# Patient Record
Sex: Female | Born: 1960 | Race: White | Hispanic: No | Smoking: Never smoker
Health system: Southern US, Community
[De-identification: ages and names within clinical notes are randomized; demographics above are authoritative.]

## PROBLEM LIST (undated history)

## (undated) DIAGNOSIS — K219 Gastro-esophageal reflux disease without esophagitis: Secondary | ICD-10-CM

## (undated) DIAGNOSIS — T7840XA Allergy, unspecified, initial encounter: Secondary | ICD-10-CM

## (undated) DIAGNOSIS — I10 Essential (primary) hypertension: Secondary | ICD-10-CM

## (undated) DIAGNOSIS — E7800 Pure hypercholesterolemia, unspecified: Secondary | ICD-10-CM

## (undated) DIAGNOSIS — M1990 Unspecified osteoarthritis, unspecified site: Secondary | ICD-10-CM

## (undated) DIAGNOSIS — IMO0002 Reserved for concepts with insufficient information to code with codable children: Secondary | ICD-10-CM

## (undated) DIAGNOSIS — I639 Cerebral infarction, unspecified: Secondary | ICD-10-CM

## (undated) DIAGNOSIS — E079 Disorder of thyroid, unspecified: Secondary | ICD-10-CM

## (undated) DIAGNOSIS — G4730 Sleep apnea, unspecified: Secondary | ICD-10-CM

## (undated) HISTORY — PX: BACK SURGERY: SHX140

## (undated) HISTORY — PX: KNEE ARTHROSCOPY: SUR90

## (undated) HISTORY — PX: WRIST ARTHROSCOPY: SHX838

## (undated) HISTORY — PX: CHOLECYSTECTOMY: SHX55

## (undated) HISTORY — PX: ABDOMINAL HYSTERECTOMY: SHX81

## (undated) HISTORY — DX: Unspecified osteoarthritis, unspecified site: M19.90

## (undated) HISTORY — DX: Gastro-esophageal reflux disease without esophagitis: K21.9

## (undated) HISTORY — DX: Reserved for concepts with insufficient information to code with codable children: IMO0002

## (undated) HISTORY — DX: Sleep apnea, unspecified: G47.30

## (undated) HISTORY — DX: Cerebral infarction, unspecified: I63.9

## (undated) HISTORY — DX: Allergy, unspecified, initial encounter: T78.40XA

---

## 2013-03-06 ENCOUNTER — Encounter (HOSPITAL_COMMUNITY): Payer: Self-pay | Admitting: *Deleted

## 2013-03-06 DIAGNOSIS — R1110 Vomiting, unspecified: Secondary | ICD-10-CM | POA: Insufficient documentation

## 2013-03-06 DIAGNOSIS — G43909 Migraine, unspecified, not intractable, without status migrainosus: Secondary | ICD-10-CM | POA: Insufficient documentation

## 2013-03-06 DIAGNOSIS — Z8639 Personal history of other endocrine, nutritional and metabolic disease: Secondary | ICD-10-CM | POA: Insufficient documentation

## 2013-03-06 DIAGNOSIS — I10 Essential (primary) hypertension: Secondary | ICD-10-CM | POA: Insufficient documentation

## 2013-03-06 DIAGNOSIS — E079 Disorder of thyroid, unspecified: Secondary | ICD-10-CM | POA: Insufficient documentation

## 2013-03-06 DIAGNOSIS — Z862 Personal history of diseases of the blood and blood-forming organs and certain disorders involving the immune mechanism: Secondary | ICD-10-CM | POA: Insufficient documentation

## 2013-03-06 DIAGNOSIS — Z880 Allergy status to penicillin: Secondary | ICD-10-CM | POA: Insufficient documentation

## 2013-03-06 DIAGNOSIS — Z79899 Other long term (current) drug therapy: Secondary | ICD-10-CM | POA: Insufficient documentation

## 2013-03-06 DIAGNOSIS — H579 Unspecified disorder of eye and adnexa: Secondary | ICD-10-CM | POA: Insufficient documentation

## 2013-03-06 NOTE — ED Notes (Addendum)
Pt c/o bilateral eye pain that started with a "blinking bright light off to her left." Also, c/o headache, n/v. Currently being treated for a right ear infection taking neomycin.

## 2013-03-07 ENCOUNTER — Emergency Department (HOSPITAL_COMMUNITY)
Admission: EM | Admit: 2013-03-07 | Discharge: 2013-03-07 | Disposition: A | Discharge status: Home / Self Care | Payer: BC Managed Care – PPO | Attending: Emergency Medicine | Admitting: Emergency Medicine

## 2013-03-07 ENCOUNTER — Emergency Department (HOSPITAL_COMMUNITY): Payer: BC Managed Care – PPO

## 2013-03-07 DIAGNOSIS — G43909 Migraine, unspecified, not intractable, without status migrainosus: Secondary | ICD-10-CM

## 2013-03-07 HISTORY — DX: Essential (primary) hypertension: I10

## 2013-03-07 HISTORY — DX: Pure hypercholesterolemia, unspecified: E78.00

## 2013-03-07 HISTORY — DX: Disorder of thyroid, unspecified: E07.9

## 2013-03-07 MED ORDER — DIPHENHYDRAMINE HCL 50 MG/ML IJ SOLN
25.0000 mg | Freq: Once | INTRAMUSCULAR | Status: AC
  Administered 2013-03-07: 25 mg via INTRAVENOUS
  Filled 2013-03-07: qty 1

## 2013-03-07 MED ORDER — MORPHINE SULFATE 4 MG/ML IJ SOLN: 4.0000 mg | Freq: Once | INTRAMUSCULAR | Status: DC

## 2013-03-07 MED ORDER — KETOROLAC TROMETHAMINE 30 MG/ML IJ SOLN
30.0000 mg | Freq: Once | INTRAMUSCULAR | Status: AC
  Administered 2013-03-07: 30 mg via INTRAVENOUS
  Filled 2013-03-07: qty 1

## 2013-03-07 MED ORDER — ONDANSETRON HCL 4 MG/2ML IJ SOLN
4.0000 mg | Freq: Once | INTRAMUSCULAR | Status: AC
  Administered 2013-03-07: 4 mg via INTRAVENOUS
  Filled 2013-03-07: qty 2

## 2013-03-07 MED ORDER — HYDROCODONE-ACETAMINOPHEN 5-325 MG PO TABS: 2.0000 | ORAL_TABLET | ORAL | Status: DC | PRN

## 2013-03-07 MED ORDER — SODIUM CHLORIDE 0.9 % IV SOLN
Freq: Once | INTRAVENOUS | Status: AC
  Administered 2013-03-07: 1000 mL via INTRAVENOUS

## 2013-03-07 MED ORDER — DEXAMETHASONE SODIUM PHOSPHATE 10 MG/ML IJ SOLN
10.0000 mg | Freq: Once | INTRAMUSCULAR | Status: AC
  Administered 2013-03-07: 10 mg via INTRAVENOUS
  Filled 2013-03-07: qty 1

## 2013-03-07 NOTE — ED Notes (Signed)
Pt reports cycles on seeing a flashing light to the left of her peripheral vision. Followed by headache & vomiting. Pt does state some nausea at present.

## 2013-03-07 NOTE — ED Notes (Signed)
Pt alert & oriented x4, stable gait. Patient  given discharge instructions, paperwork & prescription(s). Patient verbalized understanding. Pt left department w/ no further questions. 

## 2013-03-07 NOTE — ED Provider Notes (Signed)
History     CSN: [id number]  Arrival date & time 03/06/13  2311   First MD Initiated Contact with Patient 03/07/13 0055      Chief Complaint  Patient presents with  . Eye Pain  . Headache  . Emesis    (Consider location/radiation/quality/duration/timing/severity/associated sxs/prior treatment) HPI Comments: Patient started yesterday morning with eye pain, seeing spots and lights out of both eyes followed by headache that lasted throughout the day.  It started to improve this morning, then returned today.  No injury or trauma.  No fever, chills, or stiff neck.   Patient is a 52 y.o. female presenting with headaches. The history is provided by the patient.  Headache Pain location:  Frontal Quality:  Dull Radiates to:  Does not radiate Onset quality:  Sudden Duration:  36 hours Timing:  Constant Progression:  Worsening Chronicity:  New Context: activity and bright light   Relieved by:  Nothing Worsened by:  Nothing tried Ineffective treatments:  Acetaminophen and NSAIDs Associated symptoms: no abdominal pain, no back pain, no fever and no seizures     Past Medical History  Diagnosis Date  . Hypertension   . Thyroid disease   . High cholesterol     Past Surgical History  Procedure Laterality Date  . Cholecystectomy    . Abdominal hysterectomy    . Knee arthroscopy    . Wrist arthroscopy    . Back surgery      herniated disc    History reviewed. No pertinent family history.  History  Substance Use Topics  . Smoking status: Never Smoker   . Smokeless tobacco: Not on file  . Alcohol Use: No    OB History   Grav Para Term Preterm Abortions TAB SAB Ect Mult Living                  Review of Systems  Constitutional: Negative for fever.  Gastrointestinal: Negative for abdominal pain.  Musculoskeletal: Negative for back pain.  Neurological: Positive for headaches. Negative for seizures.  All other systems reviewed and are negative.    Allergies   Amoxicillin and Augmentin  Home Medications   Current Outpatient Rx  Name  Route  Sig  Dispense  Refill  . atenolol (TENORMIN) 50 MG tablet   Oral   Take 50 mg by mouth daily.         Marland Kitchen levothyroxine (SYNTHROID, LEVOTHROID) 125 MCG tablet   Oral   Take 125 mcg by mouth daily before breakfast.         . lisinopril (PRINIVIL,ZESTRIL) 20 MG tablet   Oral   Take 20 mg by mouth daily.         Marland Kitchen neomycin-polymyxin-hydrocortisone (CORTISPORIN) otic solution      3 drops 4 (four) times daily.           BP 172/102  Pulse 71  Temp(Src) 98.7 F (37.1 C)  Resp 20  Ht 5\' 6"  (1.676 m)  Wt 193 lb (87.544 kg)  BMI 31.17 kg/m2  SpO2 100%  Physical Exam  Nursing note and vitals reviewed. Constitutional: She is oriented to person, place, and time. She appears well-developed and well-nourished. No distress.  HENT:  Head: Normocephalic and atraumatic.  Eyes: EOM are normal. Pupils are equal, round, and reactive to light.  No papilledema on fundoscopic exam.  Neck: Normal range of motion. Neck supple.  Cardiovascular: Normal rate and regular rhythm.  Exam reveals no gallop and no friction rub.   No  murmur heard. Pulmonary/Chest: Effort normal and breath sounds normal. No respiratory distress. She has no wheezes.  Abdominal: Soft. Bowel sounds are normal. She exhibits no distension. There is no tenderness.  Musculoskeletal: Normal range of motion.  Neurological: She is alert and oriented to person, place, and time. No cranial nerve deficit. She exhibits normal muscle tone. Coordination normal.  Skin: Skin is warm and dry. She is not diaphoretic.    ED Course  Procedures (including critical care time)  Labs Reviewed - No data to display Ct Head Wo Contrast  03/07/2013   *RADIOLOGY REPORT*  Clinical Data: 52 year old female with headache and nausea/vomiting.  CT HEAD WITHOUT CONTRAST  Technique:  Contiguous axial images were obtained from the base of the skull through the  vertex without contrast.  Comparison: None  Findings: No intracranial abnormalities are identified, including mass lesion or mass effect, hydrocephalus, extra-axial fluid collection, midline shift, hemorrhage, or acute infarction.  The visualized bony calvarium is unremarkable.  IMPRESSION: Unremarkable noncontrast head CT.   Original Report Authenticated By: Harmon Pier, M.D.     No diagnosis found.    MDM  Patient given migraine cocktail with good results.   Head ct negative.  I see no indication for LP.  Will discharge to home, return prn.        Geoffery Lyons, MD 03/07/13 [phone]

## 2014-03-29 ENCOUNTER — Other Ambulatory Visit (HOSPITAL_COMMUNITY): Payer: Self-pay | Admitting: Family Medicine

## 2014-03-29 DIAGNOSIS — Z1231 Encounter for screening mammogram for malignant neoplasm of breast: Secondary | ICD-10-CM

## 2014-04-08 ENCOUNTER — Ambulatory Visit (HOSPITAL_COMMUNITY): Payer: BC Managed Care – PPO

## 2014-06-03 ENCOUNTER — Inpatient Hospital Stay (HOSPITAL_COMMUNITY): Admission: RE | Admit: 2014-06-03 | Payer: BC Managed Care – PPO | Source: Ambulatory Visit

## 2016-11-14 ENCOUNTER — Other Ambulatory Visit (HOSPITAL_BASED_OUTPATIENT_CLINIC_OR_DEPARTMENT_OTHER): Payer: Self-pay | Admitting: Orthopedic Surgery

## 2016-11-14 DIAGNOSIS — M25461 Effusion, right knee: Secondary | ICD-10-CM

## 2016-11-14 DIAGNOSIS — M2391 Unspecified internal derangement of right knee: Secondary | ICD-10-CM

## 2016-11-17 ENCOUNTER — Ambulatory Visit (HOSPITAL_BASED_OUTPATIENT_CLINIC_OR_DEPARTMENT_OTHER): Payer: BLUE CROSS/BLUE SHIELD

## 2020-02-02 ENCOUNTER — Emergency Department (HOSPITAL_COMMUNITY): Payer: 59

## 2020-02-02 ENCOUNTER — Other Ambulatory Visit: Payer: Self-pay

## 2020-02-02 ENCOUNTER — Encounter (HOSPITAL_COMMUNITY): Payer: Self-pay | Admitting: *Deleted

## 2020-02-02 ENCOUNTER — Emergency Department (HOSPITAL_COMMUNITY)
Admission: EM | Admit: 2020-02-02 | Discharge: 2020-02-02 | Disposition: A | Discharge status: Home / Self Care | Payer: 59 | Attending: Emergency Medicine | Admitting: Emergency Medicine

## 2020-02-02 DIAGNOSIS — R1110 Vomiting, unspecified: Secondary | ICD-10-CM | POA: Insufficient documentation

## 2020-02-02 DIAGNOSIS — R519 Headache, unspecified: Secondary | ICD-10-CM | POA: Diagnosis present

## 2020-02-02 DIAGNOSIS — G43109 Migraine with aura, not intractable, without status migrainosus: Secondary | ICD-10-CM

## 2020-02-02 DIAGNOSIS — R202 Paresthesia of skin: Secondary | ICD-10-CM | POA: Diagnosis not present

## 2020-02-02 DIAGNOSIS — Z8673 Personal history of transient ischemic attack (TIA), and cerebral infarction without residual deficits: Secondary | ICD-10-CM | POA: Diagnosis not present

## 2020-02-02 DIAGNOSIS — R531 Weakness: Secondary | ICD-10-CM | POA: Diagnosis not present

## 2020-02-02 DIAGNOSIS — I10 Essential (primary) hypertension: Secondary | ICD-10-CM | POA: Insufficient documentation

## 2020-02-02 DIAGNOSIS — Z79899 Other long term (current) drug therapy: Secondary | ICD-10-CM | POA: Diagnosis not present

## 2020-02-02 LAB — CBC WITH DIFFERENTIAL/PLATELET
Abs Immature Granulocytes: 0.04 10*3/uL (ref 0.00–0.07)
Basophils Absolute: 0.1 10*3/uL (ref 0.0–0.1)
Basophils Relative: 1 %
Eosinophils Absolute: 0 10*3/uL (ref 0.0–0.5)
Eosinophils Relative: 0 %
HCT: 46.7 % — ABNORMAL HIGH (ref 36.0–46.0)
Hemoglobin: 15.5 g/dL — ABNORMAL HIGH (ref 12.0–15.0)
Immature Granulocytes: 0 %
Lymphocytes Relative: 9 %
Lymphs Abs: 1.3 10*3/uL (ref 0.7–4.0)
MCH: 27.3 pg (ref 26.0–34.0)
MCHC: 33.2 g/dL (ref 30.0–36.0)
MCV: 82.4 fL (ref 80.0–100.0)
Monocytes Absolute: 0.3 10*3/uL (ref 0.1–1.0)
Monocytes Relative: 2 %
Neutro Abs: 12.5 10*3/uL — ABNORMAL HIGH (ref 1.7–7.7)
Neutrophils Relative %: 88 %
Platelets: 329 10*3/uL (ref 150–400)
RBC: 5.67 MIL/uL — ABNORMAL HIGH (ref 3.87–5.11)
RDW: 12.5 % (ref 11.5–15.5)
WBC: 14.2 10*3/uL — ABNORMAL HIGH (ref 4.0–10.5)
nRBC: 0 % (ref 0.0–0.2)

## 2020-02-02 LAB — BASIC METABOLIC PANEL
Anion gap: 13 (ref 5–15)
BUN: 9 mg/dL (ref 6–20)
CO2: 24 mmol/L (ref 22–32)
Calcium: 9.6 mg/dL (ref 8.9–10.3)
Chloride: 102 mmol/L (ref 98–111)
Creatinine, Ser: 0.76 mg/dL (ref 0.44–1.00)
GFR calc Af Amer: >60 mL/min (ref >60)
GFR calc non Af Amer: >60 mL/min (ref >60)
Glucose, Bld: 138 mg/dL — ABNORMAL HIGH (ref 70–99)
Potassium: 3.4 mmol/L — ABNORMAL LOW (ref 3.5–5.1)
Sodium: 139 mmol/L (ref 135–145)

## 2020-02-02 MED ORDER — DIPHENHYDRAMINE HCL 50 MG/ML IJ SOLN
25.0000 mg | Freq: Once | INTRAMUSCULAR | Status: AC
  Administered 2020-02-02: 11:00:00 25 mg via INTRAVENOUS
  Filled 2020-02-02: qty 1

## 2020-02-02 MED ORDER — HYDROMORPHONE HCL 1 MG/ML IJ SOLN
0.5000 mg | Freq: Once | INTRAMUSCULAR | Status: AC
  Administered 2020-02-02: 12:00:00 0.5 mg via INTRAVENOUS
  Filled 2020-02-02: qty 1

## 2020-02-02 MED ORDER — ONDANSETRON HCL 4 MG/2ML IJ SOLN
4.0000 mg | Freq: Once | INTRAMUSCULAR | Status: AC
  Administered 2020-02-02: 12:00:00 4 mg via INTRAVENOUS
  Filled 2020-02-02: qty 2

## 2020-02-02 MED ORDER — KETOROLAC TROMETHAMINE 30 MG/ML IJ SOLN
30.0000 mg | Freq: Once | INTRAMUSCULAR | Status: AC
  Administered 2020-02-02: 12:00:00 30 mg via INTRAVENOUS
  Filled 2020-02-02: qty 1

## 2020-02-02 MED ORDER — METOCLOPRAMIDE HCL 5 MG/ML IJ SOLN
10.0000 mg | Freq: Once | INTRAMUSCULAR | Status: AC
  Administered 2020-02-02: 10 mg via INTRAVENOUS
  Filled 2020-02-02: qty 2

## 2020-02-02 NOTE — ED Triage Notes (Signed)
Patient comes to the ED with a migraine for one day.  Patient has pain across forehead and behind both eyes.  Patient has been vomiting.  Patient has history of a lacunar stroke and migraine, hypertension.  Patient has taken her BP meds today, and has taken Eletriptan 20mg  once yesterday with minimal relief.

## 2020-02-02 NOTE — ED Provider Notes (Signed)
[facility] EMERGENCY DEPARTMENT Provider Note   CSN: [id number] Arrival date & time: 02/02/20  0944     History Chief Complaint  Patient presents with  . Migraine    Monique Stewart is a 59 y.o. female.  HPI      Monique Stewart is a 59 y.o. 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  She describes a pain that began in the left side of her head that radiates to the top and to the right side of her head and behind both eyes.  Headache has been associated with some feeling of weakness and tingling to her left face.  She states the headache feels similar to her previous headache that she had in January when she had her stoke.  She reports multiple episodes of vomiting since yesterday which is typical with her headaches.  She took Eletriptan 20 mg yesterday with minimal relief.  She denies chest pain, shortness of breath, fever, neck pain or stiffness, visual changes, facial weakness,  abdominal pain and diarrhea.     Past Medical History:  Diagnosis Date  . High cholesterol   . Hypertension   . Thyroid disease     There are no problems to display for this patient.   Past Surgical History:  Procedure Laterality Date  . ABDOMINAL HYSTERECTOMY    . BACK SURGERY     herniated disc  . CHOLECYSTECTOMY    . KNEE ARTHROSCOPY    . WRIST ARTHROSCOPY       OB History   No obstetric history on file.     History reviewed. No pertinent family history.  Social History   Tobacco Use  . Smoking status: Never Smoker  . Smokeless tobacco: Never Used  Substance Use Topics  . Alcohol use: No  . Drug use: No    Home Medications Prior to Admission medications   Medication Sig Start Date End Date Taking? Authorizing Provider  atenolol (TENORMIN) 50 MG tablet Take 50 mg by mouth daily.    [provider]  HYDROcodone-acetaminophen (NORCO) 5-325 MG per tablet Take 2 tablets by mouth every 4 (four) hours as needed for pain. 03/07/13   Veryl Speak, MD  levothyroxine (SYNTHROID, LEVOTHROID) 125 MCG tablet Take 125 mcg by mouth daily before breakfast.    [provider]  lisinopril (PRINIVIL,ZESTRIL) 20 MG tablet Take 20 mg by mouth daily.    [provider]  neomycin-polymyxin-hydrocortisone (CORTISPORIN) otic solution 3 drops 4 (four) times daily.    [provider]    Allergies    Amoxicillin and Augmentin [amoxicillin-pot clavulanate]  Review of Systems   Review of Systems  Constitutional: Negative for activity change, appetite change and fever.  HENT: Negative for facial swelling and trouble swallowing.   Eyes: Positive for photophobia. Negative for pain and visual disturbance.  Respiratory: Negative for shortness of breath.   Cardiovascular: Negative for chest pain.  Gastrointestinal: Positive for nausea and vomiting. Negative for abdominal pain.  Genitourinary: Negative for difficulty urinating and dysuria.  Musculoskeletal: Negative for neck pain and neck stiffness.  Skin: Negative for rash and wound.  Neurological: Positive for headaches. Negative for dizziness, facial asymmetry, speech difficulty, weakness and numbness (left facial numbness).  Psychiatric/Behavioral: Negative for confusion and decreased concentration.    Physical Exam Updated Vital Signs BP (!) 177/101 (BP Location: Right Arm)   Pulse  95   Temp 98.3 F (36.8 C) (Oral)   Resp 18   Ht 5' 5.5" (1.664 m)   Wt 87.1 kg   SpO2 97%   BMI 31.46 kg/m   Physical Exam Vitals and nursing note reviewed.  Constitutional:      General: She is not in acute distress.    Appearance: Normal appearance. She is well-developed.  HENT:     Head: Normocephalic.     Mouth/Throat:     Mouth: Mucous membranes are moist.  Eyes:     Extraocular Movements: Extraocular movements intact.       Conjunctiva/sclera: Conjunctivae normal.     Pupils: Pupils are equal, round, and reactive to light.  Neck:     Trachea: Phonation normal.     Meningeal: Kernig's sign absent.  Cardiovascular:     Rate and Rhythm: Normal rate and regular rhythm.     Pulses: Normal pulses.  Pulmonary:     Effort: Pulmonary effort is normal. No respiratory distress.     Breath sounds: Normal breath sounds.  Abdominal:     General: There is no distension.     Palpations: Abdomen is soft.     Tenderness: There is no abdominal tenderness.  Musculoskeletal:        General: Normal range of motion.     Cervical back: Normal range of motion and neck supple. No rigidity or tenderness. No spinous process tenderness or muscular tenderness.  Skin:    General: Skin is warm and dry.     Capillary Refill: Capillary refill takes less than 2 seconds.     Findings: No rash.  Neurological:     General: No focal deficit present.     Mental Status: She is alert.     GCS: GCS eye subscore is 4. GCS verbal subscore is 5. GCS motor subscore is 6.     Cranial Nerves: No cranial nerve deficit.     Sensory: Sensation is intact. No sensory deficit.     Motor: Motor function is intact. No abnormal muscle tone.     Coordination: Coordination is intact. Coordination normal.     Gait: Gait normal.     Deep Tendon Reflexes:     Reflex Scores:      Bicep reflexes are 2+ on the left side.    Comments: CN III-XII grossly intact.  Speech clear.  No motor weakness.  No pronator drift.    Psychiatric:        Thought Content: Thought content normal.     ED Results / Procedures / Treatments   Labs (all labs ordered are listed, but only abnormal results are displayed) Labs Reviewed  BASIC METABOLIC PANEL - Abnormal; Notable for the following components:      Result Value   Potassium 3.4 (*)    Glucose, Bld 138 (*)    All other components within normal limits  CBC WITH DIFFERENTIAL/PLATELET - Abnormal; Notable for the  following components:   WBC 14.2 (*)    RBC 5.67 (*)    Hemoglobin 15.5 (*)    HCT 46.7 (*)    Neutro Abs 12.5 (*)    All other components within normal limits    EKG None  Radiology CT Head Wo Contrast  Result Date: 02/02/2020 CLINICAL DATA:  Headaches and nausea EXAM: CT HEAD WITHOUT CONTRAST TECHNIQUE: Contiguous axial images were obtained from the base of the skull through the vertex without intravenous contrast. COMPARISON:  2014 FINDINGS: Brain: There is no  acute intracranial hemorrhage, mass effect, or edema. Gray-white differentiation is preserved. There is no extra-axial fluid collection. Ventricles and sulci are within normal limits in size and configuration. Minimal patchy hypoattenuation in the supratentorial white matter is nonspecific but may reflect minor chronic microvascular ischemic changes. Vascular: No hyperdense vessel or unexpected calcification. Skull: Calvarium is unremarkable. Sinuses/Orbits: No acute finding. Other: None. IMPRESSION: No acute intracranial abnormality. Electronically Signed   By: Guadlupe Spanish M.D.   On: 02/02/2020 10:48    Procedures Procedures (including critical care time)  Medications Ordered in ED Medications  diphenhydrAMINE (BENADRYL) injection 25 mg (has no administration in time range)  metoCLOPramide (REGLAN) injection 10 mg (has no administration in time range)    ED Course  I have reviewed the triage vital signs and the nursing notes.  Pertinent labs & imaging results that were available during my care of the patient were reviewed by me and considered in my medical decision making (see chart for details).    MDM Rules/Calculators/A&P                      Pt with likely recurrent migraine headache.  No focal neuro deficits on my exam to suggest new stroke or TIA.  No facial droop or dysarthria.  No fever or meningeal signs.  She describes headache as similar to previous headache associated with her stroke.  Headache was gradual  in onset.  Will obtain labs and CT head.    Work up reassuring.  CT headache negative for acute process.  NV intact.  No further vomiting.  Will try oral fluid challenge.    On recheck, pt reports feeling better and she wants to go home.  She has tolerated fluids and crackers w/o difficulty.  Pt also seen by Dr. Denton Lank and care plan discussed.      Final Clinical Impression(s) / ED Diagnoses Final diagnoses:  Migraine with aura and without status migrainosus, not intractable    Rx / DC Orders ED Discharge Orders    None       Pauline Aus, PA-C 02/02/20 1641    Cathren Laine, MD 02/03/20 1443

## 2020-02-02 NOTE — Discharge Instructions (Addendum)
The CT of your head today did not show evidence of bleeding in your brain.  The medication you were given here may cause drowsiness.  Try small, frequent sips of clear fluids today.  You may also try dry toast or crackers.  Follow-up with your primary doctor for recheck.  Return emergency department for any worsening symptoms.

## 2021-03-03 IMAGING — CT CT HEAD W/O CM
3 series · 16 of 47 positions shown, 19 images · non-contrast
Comparison: [id number]

CLINICAL DATA: Headaches and nausea

EXAM:
CT HEAD WITHOUT CONTRAST
TECHNIQUE: Contiguous axial images were obtained from the base of the skull
through the vertex without intravenous contrast.

[Series 2: head w o · axial · 0.41mm/px · z∈[+9,+134]mm · 10 of 31 slices shown, 13 images]
[im 3/31  brain]
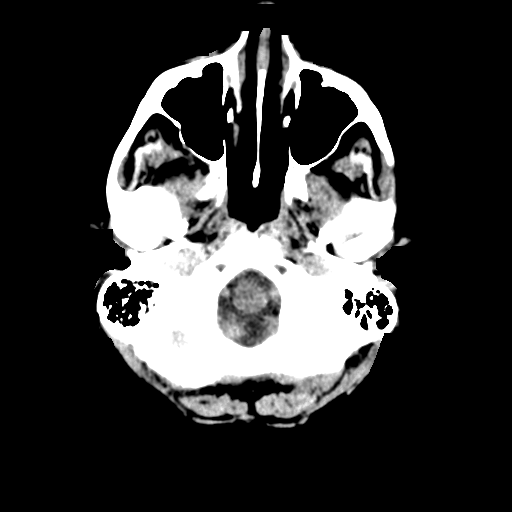
[im 3/31  bone]
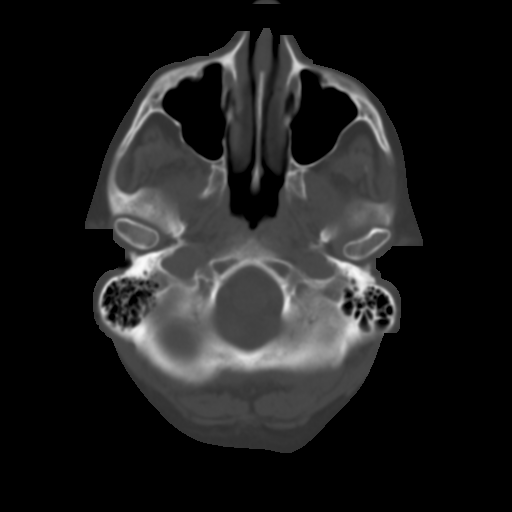
[im 6/31  brain]
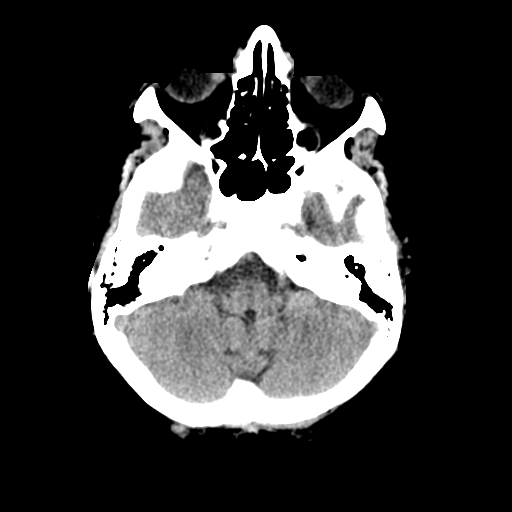
[im 9/31  brain]
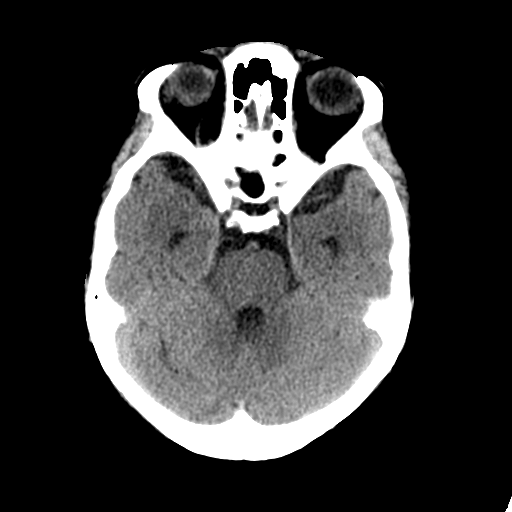
[im 11/31  brain]
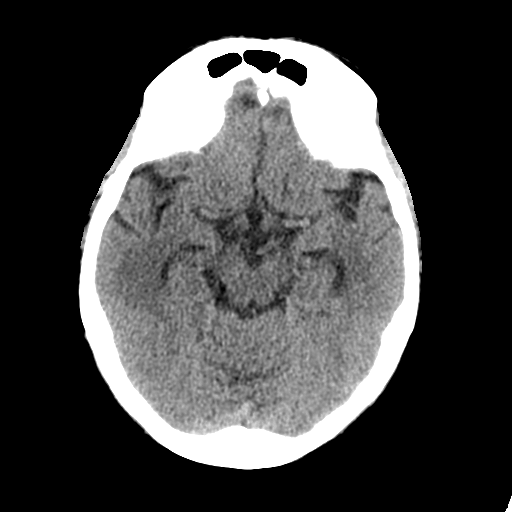
[im 14/31  brain]
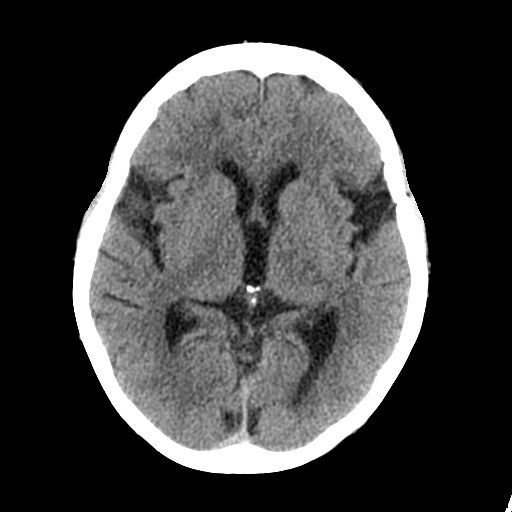
[im 14/31  bone]
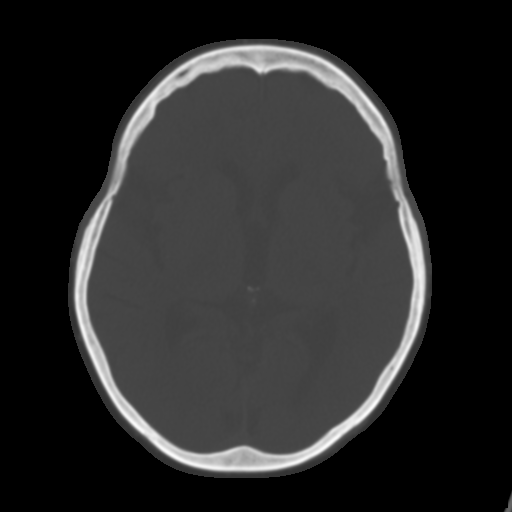
[im 17/31  brain]
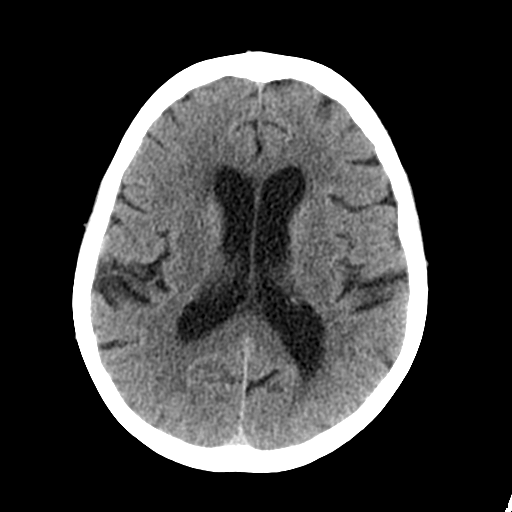
[im 20/31  brain]
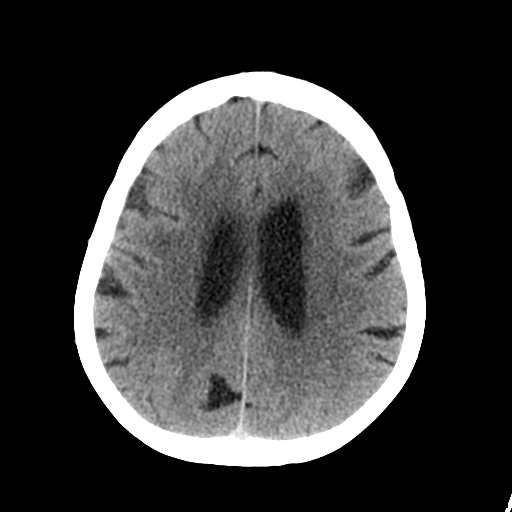
[im 23/31  brain]
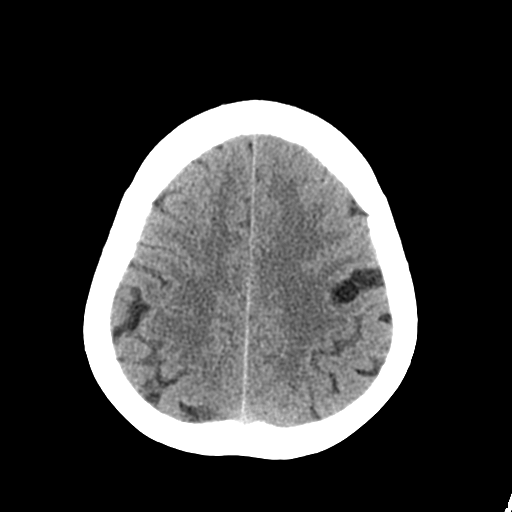
[im 25/31  brain]
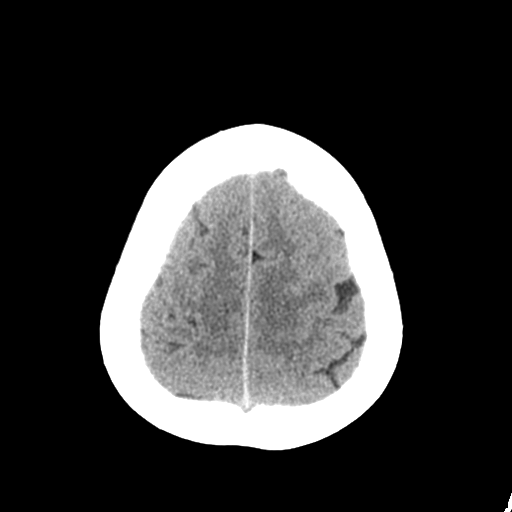
[im 25/31  bone]
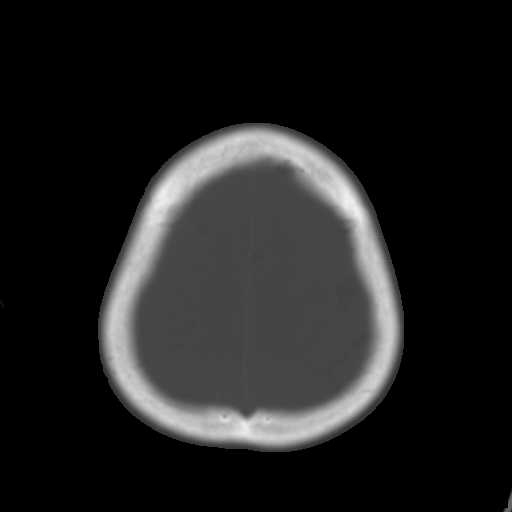
[im 28/31  brain]
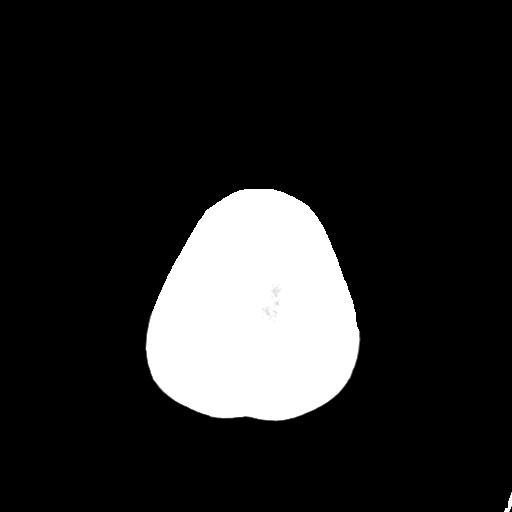

[Series 4: coronal soft · coronal · 0.32mm/px · 3 of 69 slices shown]
[im 23/69  brain]
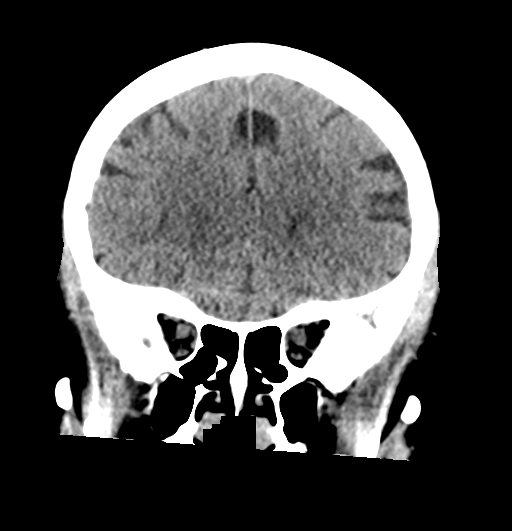
[im 31/69  brain]
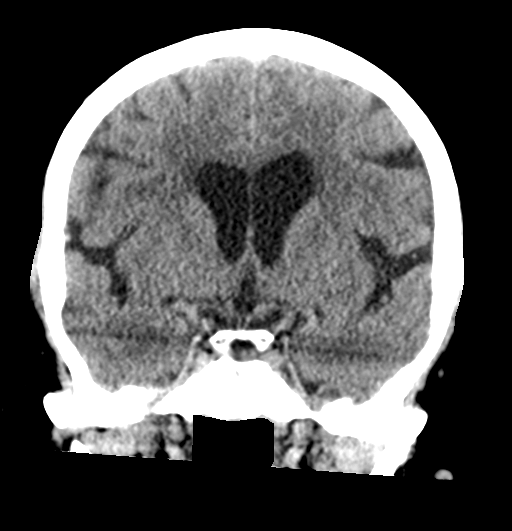
[im 38/69  brain]
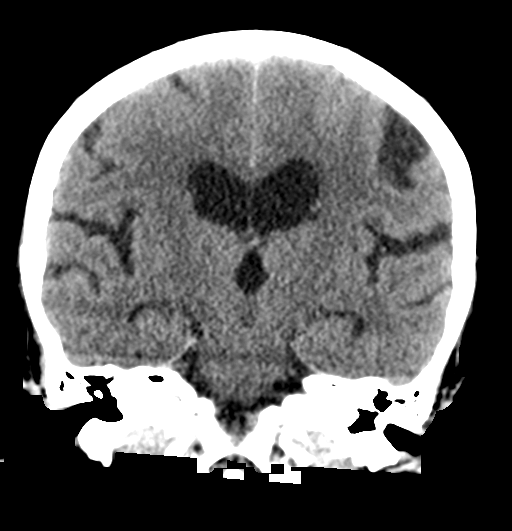

[Series 5: sagittal soft · sagittal · 0.35mm/px · 3 of 58 slices shown]
[im 20/58  brain]
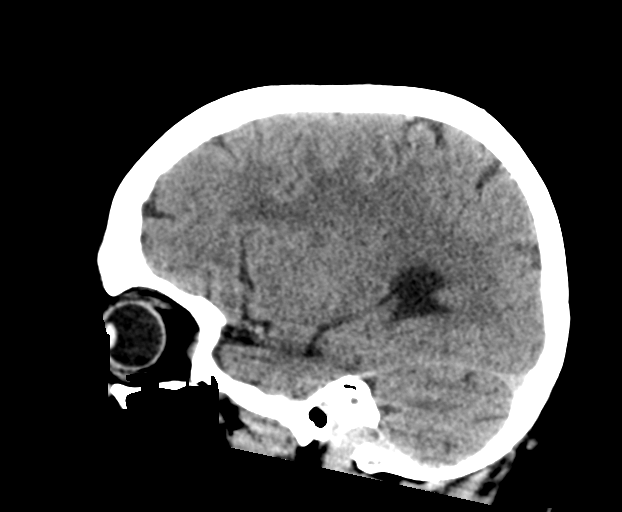
[im 29/58  brain]
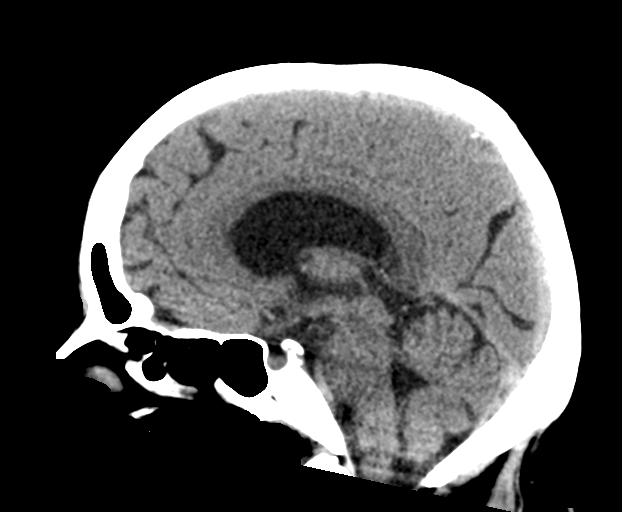
[im 39/58  brain]
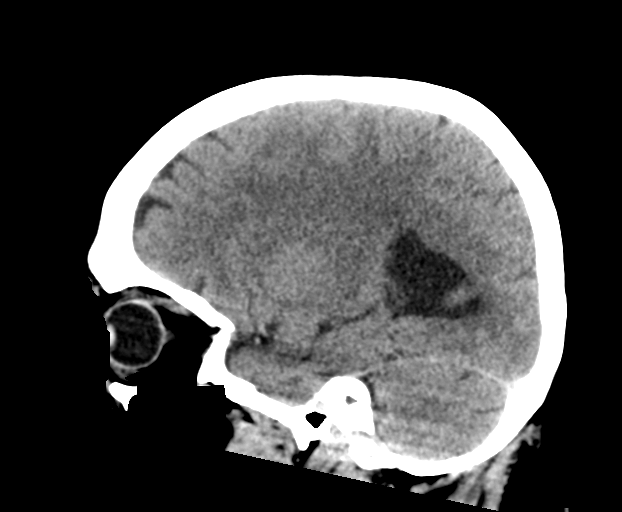

[16 of 47 positions shown; findings below may reference images not displayed]

FINDINGS: Brain: There is no acute intracranial hemorrhage, mass effect, or
edema. Gray-white differentiation is preserved. There is no
extra-axial fluid collection. Ventricles and sulci are within normal
limits in size and configuration. Minimal patchy hypoattenuation in
the supratentorial white matter is nonspecific but may reflect minor
chronic microvascular ischemic changes.

Vascular: No hyperdense vessel or unexpected calcification.

Skull: Calvarium is unremarkable.

Sinuses/Orbits: No acute finding.

Other: None.
IMPRESSION: No acute intracranial abnormality.

## 2021-10-25 ENCOUNTER — Encounter: Payer: Self-pay | Admitting: Registered Nurse

## 2021-10-25 ENCOUNTER — Other Ambulatory Visit: Payer: Self-pay

## 2021-10-25 ENCOUNTER — Ambulatory Visit (INDEPENDENT_AMBULATORY_CARE_PROVIDER_SITE_OTHER): Payer: 59 | Admitting: Registered Nurse

## 2021-10-25 ENCOUNTER — Other Ambulatory Visit: Payer: Self-pay | Admitting: Registered Nurse

## 2021-10-25 ENCOUNTER — Telehealth: Payer: Self-pay

## 2021-10-25 VITALS — BP 152/99 | HR 96 | Temp 98.4°F | Resp 18 | Ht 65.5 in | Wt 189.2 lb

## 2021-10-25 DIAGNOSIS — E782 Mixed hyperlipidemia: Secondary | ICD-10-CM

## 2021-10-25 DIAGNOSIS — R519 Headache, unspecified: Secondary | ICD-10-CM | POA: Diagnosis not present

## 2021-10-25 DIAGNOSIS — E079 Disorder of thyroid, unspecified: Secondary | ICD-10-CM

## 2021-10-25 DIAGNOSIS — M25551 Pain in right hip: Secondary | ICD-10-CM

## 2021-10-25 DIAGNOSIS — G8929 Other chronic pain: Secondary | ICD-10-CM

## 2021-10-25 DIAGNOSIS — I10 Essential (primary) hypertension: Secondary | ICD-10-CM | POA: Diagnosis not present

## 2021-10-25 MED ORDER — LISINOPRIL 40 MG PO TABS: 40.0000 mg | ORAL_TABLET | Freq: Every day | ORAL | 1 refills | Status: DC

## 2021-10-25 MED ORDER — TOPIRAMATE 50 MG PO TABS: 50.0000 mg | ORAL_TABLET | Freq: Every day | ORAL | 1 refills | Status: DC

## 2021-10-25 MED ORDER — LEVOTHYROXINE SODIUM 88 MCG PO TABS: 88.0000 ug | ORAL_TABLET | Freq: Every day | ORAL | 1 refills | Status: DC

## 2021-10-25 MED ORDER — PROPRANOLOL HCL 10 MG PO TABS: 10.0000 mg | ORAL_TABLET | Freq: Every day | ORAL | 1 refills | Status: DC

## 2021-10-25 MED ORDER — TOPIRAMATE 50 MG PO TABS: 50.0000 mg | ORAL_TABLET | Freq: Two times a day (BID) | ORAL | 1 refills | Status: DC

## 2021-10-25 MED ORDER — LEVOTHYROXINE SODIUM 88 MCG PO TABS: 88.0000 ug | ORAL_TABLET | Freq: Two times a day (BID) | ORAL | 0 refills | Status: AC

## 2021-10-25 MED ORDER — TOPIRAMATE 50 MG PO TABS: 50.0000 mg | ORAL_TABLET | Freq: Two times a day (BID) | ORAL | 0 refills | Status: DC

## 2021-10-25 NOTE — Telephone Encounter (Signed)
Thank you [name]!

## 2021-10-25 NOTE — Telephone Encounter (Signed)
Made a mistake with medication and put once daily and it was actually twice. Corrected and sent to the patients pharmacy. Pt is aware.

## 2021-10-25 NOTE — Telephone Encounter (Signed)
,  Caller name:Cathi Ryder   On DPR? :Yes   Call back number:[phone]  Provider they see: Richard  Reason for call:Pt come back to the office the rx that she picked up topiramate 50 mg was for 180 quantity take twice daily she is reading on her medication list and when I pull medication list it is showing 1 tablet by mouth daily quantity 90? She is saying this medication it twice daily can this be corrected she brought her other bottle showing twice daily

## 2021-10-25 NOTE — Progress Notes (Signed)
New Patient Office Visit  Subjective:  Patient ID: Monique Stewart, female    DOB: May 17, 1961  Age: 61 y.o. MRN: [id number]  CC:  Chief Complaint  Patient presents with   New Patient (Initial Visit)    Patient states she is here to establish care and medication refill.    HPI Monique Stewart presents for visit to est care.  Needs med refills:  Hypertension: Patient Currently taking: propranolol 10mg  po qd, lisinopril 40mg  po qd Good effect. No AEs. Denies CV symptoms including: chest pain, shob, doe, headache, visual changes, fatigue, claudication, and dependent edema.   Previous readings and labs: BP Readings from Last 3 Encounters:  10/25/21 (!) 152/99  02/02/20 134/77  03/06/13 (!) 172/102   Lab Results  Component Value Date   CREATININE 0.76 02/02/2020   Hypothyroid Controlled with synthroid 05/06/13 po qd No acute concerns. No symptoms of thyroid dysfunction.   Frequent headaches Takes topamax 50mg  po bid Good effect, no AE Hopes to continue.  Hip pain: Fall in end of July. Mechanical fall - ladder collapsed underneath her R side pain. Improving slowly  Had a difficult time rising from a sitting position.  Sore when moving around, but improved with more movement.  Hx of 2x herniated lumbar discs with repair    Past Medical History:  Diagnosis Date   High cholesterol    Hypertension    Thyroid disease     Past Surgical History:  Procedure Laterality Date   ABDOMINAL HYSTERECTOMY     BACK SURGERY     herniated disc   CHOLECYSTECTOMY     KNEE ARTHROSCOPY     WRIST ARTHROSCOPY      No family history on file.  Social History   Socioeconomic History   Marital status: Widowed    Spouse name: Not on file   Number of children: Not on file   Years of education: Not on file   Highest education level: Not on file  Occupational History   Not on file  Tobacco Use   Smoking status: Never   Smokeless tobacco: Never  Substance and Sexual  Activity   Alcohol use: No   Drug use: No   Sexual activity: Not on file  Other Topics Concern   Not on file  Social History Narrative   Not on file   Social Determinants of Health   Financial Resource Strain: Not on file  Food Insecurity: Not on file  Transportation Needs: Not on file  Physical Activity: Not on file  Stress: Not on file  Social Connections: Not on file  Intimate Partner Violence: Not on file    ROS Review of Systems  Constitutional: Negative.   HENT: Negative.    Eyes: Negative.   Respiratory: Negative.    Cardiovascular: Negative.   Gastrointestinal: Negative.   Genitourinary: Negative.   Musculoskeletal: Negative.   Skin: Negative.   Neurological: Negative.   Psychiatric/Behavioral: Negative.    All other systems reviewed and are negative.  Objective:   Today's Vitals: BP (!) 152/99    Pulse 96    Temp 98.4 F (36.9 C) (Temporal)    Resp 18    Ht 5' 5.5" (1.664 m)    Wt 189 lb 3.2 oz (85.8 kg)    SpO2 100%    BMI 31.01 kg/m   Physical Exam Vitals and nursing note reviewed.  Constitutional:      General: She is not in acute distress.    Appearance: Normal appearance. She is  not ill-appearing, toxic-appearing or diaphoretic.  Cardiovascular:     Rate and Rhythm: Normal rate and regular rhythm.     Pulses: Normal pulses.     Heart sounds: Normal heart sounds. No murmur heard.   No friction rub. No gallop.  Pulmonary:     Effort: Pulmonary effort is normal. No respiratory distress.     Breath sounds: Normal breath sounds. No stridor. No wheezing, rhonchi or rales.  Chest:     Chest wall: No tenderness.  Skin:    General: Skin is warm and dry.     Capillary Refill: Capillary refill takes less than 2 seconds.  Neurological:     General: No focal deficit present.     Mental Status: She is alert and oriented to person, place, and time. Mental status is at baseline.  Psychiatric:        Mood and Affect: Mood normal.        Behavior: Behavior  normal.        Thought Content: Thought content normal.        Judgment: Judgment normal.    Assessment & Plan:   Problem List Items Addressed This Visit   None   Outpatient Encounter Medications as of 10/25/2021  Medication Sig   amLODipine (NORVASC) 5 MG tablet Take 5 mg by mouth every other day.   eletriptan (RELPAX) 20 MG tablet TAKE 1 TABLET BY MOUTH AS NEEDED FOR MIGRAINE MAY REPEAT IN 2 HOURS IF NECESSARY   HYDROcodone-acetaminophen (NORCO) 5-325 MG per tablet Take 2 tablets by mouth every 4 (four) hours as needed for pain. (Patient not taking: Reported on 02/02/2020)   levothyroxine (SYNTHROID) 88 MCG tablet Take 88 mcg by mouth daily.   lisinopril (ZESTRIL) 40 MG tablet Take 40 mg by mouth daily.   neomycin-bacitracin-polymyxin (NEOSPORIN) [phone] ointment Apply 1 application topically daily as needed.   neomycin-polymyxin-hydrocortisone (CORTISPORIN) otic solution 3 drops 4 (four) times daily.   propranolol (INDERAL) 10 MG tablet Take 10 mg by mouth daily.   propranolol ER (INDERAL LA) 60 MG 24 hr capsule Take 60 mg by mouth every other day.    topiramate (TOPAMAX) 50 MG tablet Take 50 mg by mouth daily.   No facility-administered encounter medications on file as of 10/25/2021.    Follow-up: No follow-ups on file.   PLAN Meds refilled as above. Return in 6 mo Dg hips and lumbar spine ordered. Follow up pending results.  Labs collected. Will follow up with the patient as warranted. Patient encouraged to call clinic with any questions, comments, or concerns.  Monique Agee, NP

## 2021-10-25 NOTE — Patient Instructions (Addendum)
[name] to meet you!  Check out [employer] off of [address], or check out [place] downtown. For my money, those are the two best quick sandwiches around town.   I'll refill all meds  Come back in for labs in 2-3 weeks. If you would like to come here, we should pencil you in for a lab only visit. If you'd prefer, you can walk in at [facility] at [employer].   I'll touch base after labs  Let's plan on touching base in 6 mo to see how things are going  Thank you  [name]     If you have lab work done today you will be contacted with your lab results within the next 2 weeks.  If you have not heard from [place] then please contact us. The fastest way to get your results is to register for My Chart.   IF you received an x-ray today, you will receive an invoice from [facility]. Please contact [facility] at [phone] with questions or concerns regarding your invoice.   IF you received labwork today, you will receive an invoice from [place]. Please contact [facility] at [phone] with questions or concerns regarding your invoice.   Our billing staff will not be able to assist you with questions regarding bills from these companies.  You will be contacted with the lab results as soon as they are available. The fastest way to get your results is to activate your My Chart account. Instructions are located on the last page of this paperwork. If you have not heard from [place] regarding the results in 2 weeks, please contact this office.

## 2021-11-08 ENCOUNTER — Other Ambulatory Visit (INDEPENDENT_AMBULATORY_CARE_PROVIDER_SITE_OTHER): Payer: 59

## 2021-11-08 ENCOUNTER — Encounter: Payer: Self-pay | Admitting: Registered Nurse

## 2021-11-08 DIAGNOSIS — R519 Headache, unspecified: Secondary | ICD-10-CM | POA: Diagnosis not present

## 2021-11-08 DIAGNOSIS — E079 Disorder of thyroid, unspecified: Secondary | ICD-10-CM | POA: Diagnosis not present

## 2021-11-08 DIAGNOSIS — I10 Essential (primary) hypertension: Secondary | ICD-10-CM | POA: Diagnosis not present

## 2021-11-08 DIAGNOSIS — E782 Mixed hyperlipidemia: Secondary | ICD-10-CM

## 2021-11-08 LAB — TSH: TSH: 1.26 u[IU]/mL (ref 0.35–5.50)

## 2021-11-08 LAB — COMPREHENSIVE METABOLIC PANEL
ALT: 26 U/L (ref 0–35)
AST: 22 U/L (ref 0–37)
Albumin: 4.1 g/dL (ref 3.5–5.2)
Alkaline Phosphatase: 65 U/L (ref 39–117)
BUN: 16 mg/dL (ref 6–23)
CO2: 23 mEq/L (ref 19–32)
Calcium: 9.9 mg/dL (ref 8.4–10.5)
Chloride: 108 mEq/L (ref 96–112)
Creatinine, Ser: 1.14 mg/dL (ref 0.40–1.20)
GFR: 52.41 mL/min — ABNORMAL LOW (ref >60.00)
Glucose, Bld: 99 mg/dL (ref 70–99)
Potassium: 4 mEq/L (ref 3.5–5.1)
Sodium: 143 mEq/L (ref 135–145)
Total Bilirubin: 0.6 mg/dL (ref 0.2–1.2)
Total Protein: 7.2 g/dL (ref 6.0–8.3)

## 2021-11-08 LAB — CBC WITH DIFFERENTIAL/PLATELET
Basophils Absolute: 0.1 10*3/uL (ref 0.0–0.1)
Basophils Relative: 1 % (ref 0.0–3.0)
Eosinophils Absolute: 0.2 10*3/uL (ref 0.0–0.7)
Eosinophils Relative: 2.7 % (ref 0.0–5.0)
HCT: 45 % (ref 36.0–46.0)
Hemoglobin: 15.2 g/dL — ABNORMAL HIGH (ref 12.0–15.0)
Lymphocytes Relative: 25.2 % (ref 12.0–46.0)
Lymphs Abs: 1.9 10*3/uL (ref 0.7–4.0)
MCHC: 33.8 g/dL (ref 30.0–36.0)
MCV: 82.3 fl (ref 78.0–100.0)
Monocytes Absolute: 0.5 10*3/uL (ref 0.1–1.0)
Monocytes Relative: 6.5 % (ref 3.0–12.0)
Neutro Abs: 4.8 10*3/uL (ref 1.4–7.7)
Neutrophils Relative %: 64.6 % (ref 43.0–77.0)
Platelets: 249 10*3/uL (ref 150.0–400.0)
RBC: 5.46 Mil/uL — ABNORMAL HIGH (ref 3.87–5.11)
RDW: 13.7 % (ref 11.5–15.5)
WBC: 7.5 10*3/uL (ref 4.0–10.5)

## 2021-11-08 LAB — LIPID PANEL
Cholesterol: 257 mg/dL — ABNORMAL HIGH (ref 0–200)
HDL: 56 mg/dL (ref >39.00)
LDL Cholesterol: 177 mg/dL — ABNORMAL HIGH (ref 0–99)
NonHDL: 201.04
Total CHOL/HDL Ratio: 5
Triglycerides: 120 mg/dL (ref 0.0–149.0)
VLDL: 24 mg/dL (ref 0.0–40.0)

## 2021-11-08 LAB — HEMOGLOBIN A1C: Hgb A1c MFr Bld: 5.3 % (ref 4.6–6.5)

## 2021-11-08 NOTE — Telephone Encounter (Signed)
Called patient to give her the address to [facility] again .

## 2021-12-11 ENCOUNTER — Encounter: Payer: Self-pay | Admitting: Registered Nurse

## 2021-12-11 ENCOUNTER — Other Ambulatory Visit: Payer: Self-pay

## 2021-12-11 ENCOUNTER — Ambulatory Visit (INDEPENDENT_AMBULATORY_CARE_PROVIDER_SITE_OTHER): Payer: 59 | Admitting: Registered Nurse

## 2021-12-11 VITALS — BP 143/77 | HR 85 | Temp 98.1°F | Resp 18 | Ht 65.5 in | Wt 186.4 lb

## 2021-12-11 DIAGNOSIS — J069 Acute upper respiratory infection, unspecified: Secondary | ICD-10-CM

## 2021-12-11 DIAGNOSIS — H109 Unspecified conjunctivitis: Secondary | ICD-10-CM

## 2021-12-11 MED ORDER — ERYTHROMYCIN 5 MG/GM OP OINT: 1.0000 "application " | TOPICAL_OINTMENT | Freq: Two times a day (BID) | OPHTHALMIC | 0 refills | Status: AC

## 2021-12-11 MED ORDER — PREDNISONE 20 MG PO TABS
20.0000 mg | ORAL_TABLET | Freq: Every day | ORAL | 0 refills | Status: AC
Start: 2021-12-11 — End: ?

## 2021-12-11 MED ORDER — AZITHROMYCIN 250 MG PO TABS: ORAL_TABLET | ORAL | 0 refills | Status: AC

## 2021-12-11 NOTE — Patient Instructions (Addendum)
Ms. Amedee -  ? ?Great to see you ? ?Call if things worsen or fail to improve. ? ?Z pack and prednisone for upper respiratory symptoms. ? ?Erythromycin for eye ? ?Thank you, safe travels, ? ?Rich  ? ? ? ?If you have lab work done today you will be contacted with your lab results within the next 2 weeks.  If you have not heard from [place] then please contact us. The fastest way to get your results is to register for My Chart. ? ? ?IF you received an x-ray today, you will receive an invoice from [facility]. Please contact [facility] at [phone] with questions or concerns regarding your invoice.  ? ?IF you received labwork today, you will receive an invoice from [place]. Please contact [facility] at [phone] with questions or concerns regarding your invoice.  ? ?Our billing staff will not be able to assist you with questions regarding bills from these companies. ? ?You will be contacted with the lab results as soon as they are available. The fastest way to get your results is to activate your My Chart account. Instructions are located on the last page of this paperwork. If you have not heard from [place] regarding the results in 2 weeks, please contact this office. ?  ? ? ?

## 2021-12-11 NOTE — Progress Notes (Signed)
Established Patient Office Visit  Subjective:  Patient ID: Monique Stewart, female    DOB: [birth year]  Age: 61 y.o. MRN: [id number]  CC:  Chief Complaint  Patient presents with   Nasal Congestion    Patient states since last Thursday she had nasal congestion , nausea, headaches. She felt better on Friday and got worse on Sunday with some eye problems and loss of voice think she has pink eye     HPI Monique Stewart presents for nasal congestion  Onset Thursday.  Nasal congestion, nausea, headache Hoarse voice and sore throat Thinks she has pink eye - some redness, itching, crusting drainage.  Does not have infections like this often  Has not taken any OTC relief.   Past Medical History:  Diagnosis Date   Allergy    Many   Arthritis    Unknown   GERD (gastroesophageal reflux disease)    Unknown   High cholesterol    Hypertension    Sleep apnea    Unknown   Stroke ([place]) 10/2019   Thyroid disease    Ulcer    Unknown    Past Surgical History:  Procedure Laterality Date   ABDOMINAL HYSTERECTOMY     BACK SURGERY     herniated disc   CHOLECYSTECTOMY     EYE SURGERY  12/1995   [facility]   KNEE ARTHROSCOPY     SPINE SURGERY     Lower herniated discs   WRIST ARTHROSCOPY      Family History  Problem Relation Age of Onset   Breast cancer Mother    Lung cancer Mother    Anxiety disorder Mother    Cancer Mother    Heart disease Mother    Hyperlipidemia Mother    Hypertension Mother    Obesity Mother    CVA Father    Skin cancer Father    Prostate cancer Father    Arthritis Father    Cancer Father    COPD Father    Diabetes Father    Hearing loss Father    Heart disease Father    Hyperlipidemia Father    Hypertension Father    Stroke Father    Thyroid disease Sister    Diabetes Sister    Hyperlipidemia Sister    Hypertension Sister    Obesity Sister    Hyperlipidemia Sister    ADD / ADHD Brother    Alcohol abuse Brother    Diabetes Brother     Hyperlipidemia Brother    Hypertension Brother    Obesity Brother    Diabetes Maternal Grandmother    Heart disease Maternal Grandmother    Hypertension Maternal Grandmother    Obesity Maternal Grandmother    Thyroid disease Maternal Grandfather    Obesity Maternal Grandfather    Heart disease Paternal Grandmother    Hypertension Paternal Grandmother    Alcohol abuse Paternal Grandfather    Early death Paternal Grandfather     Social History   Socioeconomic History   Marital status: Widowed    Spouse name: Not on file   Number of children: 0   Years of education: Not on file   Highest education level: Not on file  Occupational History   Not on file  Tobacco Use   Smoking status: Never   Smokeless tobacco: Never   Tobacco comments:    N/A  Vaping Use   Vaping Use: Never used  Substance and Sexual Activity   Alcohol use: Never   Drug use: Never  Sexual activity: Not Currently    Comment: Hysterectomy  Other Topics Concern   Not on file  Social History Narrative   Not on file   Social Determinants of Health   Financial Resource Strain: Not on file  Food Insecurity: Not on file  Transportation Needs: Not on file  Physical Activity: Not on file  Stress: Not on file  Social Connections: Not on file  Intimate Partner Violence: Not on file    Outpatient Medications Prior to Visit  Medication Sig Dispense Refill   levothyroxine (SYNTHROID) 88 MCG tablet Take 1 tablet (88 mcg total) by mouth 2 (two) times daily. 180 tablet 0   lisinopril (ZESTRIL) 40 MG tablet Take 1 tablet (40 mg total) by mouth daily. 90 tablet 1   propranolol (INDERAL) 10 MG tablet Take 1 tablet (10 mg total) by mouth daily. 90 tablet 1   topiramate (TOPAMAX) 50 MG tablet Take 1 tablet (50 mg total) by mouth 2 (two) times daily. 180 tablet 1   No facility-administered medications prior to visit.    Allergies  Allergen Reactions   Losartan     Other reaction(s): Dizziness   Statins Other  (See Comments)    Leg pain   Amoxicillin    Augmentin [Amoxicillin-Pot Clavulanate]    Ezetimibe     Other reaction(s): Abdominal Pain Other reaction(s): Abdominal Pain     ROS Review of Systems Per hpi    Objective:    Physical Exam Vitals and nursing note reviewed.  Constitutional:      General: She is not in acute distress.    Appearance: Normal appearance. She is normal weight. She is not ill-appearing, toxic-appearing or diaphoretic.  HENT:     Right Ear: Tympanic membrane, ear canal and external ear normal. There is no impacted cerumen.     Left Ear: Tympanic membrane, ear canal and external ear normal. There is no impacted cerumen.     Nose: Congestion present.     Mouth/Throat:     Pharynx: No oropharyngeal exudate or posterior oropharyngeal erythema.  Eyes:     General: No scleral icterus.       Right eye: No discharge.        Left eye: Discharge present. Cardiovascular:     Rate and Rhythm: Normal rate and regular rhythm.     Heart sounds: Normal heart sounds. No murmur heard.   No friction rub. No gallop.  Pulmonary:     Effort: Pulmonary effort is normal. No respiratory distress.     Breath sounds: Normal breath sounds. No stridor. No wheezing, rhonchi or rales.  Chest:     Chest wall: No tenderness.  Skin:    General: Skin is warm and dry.  Neurological:     General: No focal deficit present.     Mental Status: She is alert and oriented to person, place, and time. Mental status is at baseline.  Psychiatric:        Mood and Affect: Mood normal.        Behavior: Behavior normal.        Thought Content: Thought content normal.        Judgment: Judgment normal.    BP (!) 143/77    Pulse 85    Temp 98.1 F (36.7 C) (Temporal)    Resp 18    Ht 5' 5.5" (1.664 m)    Wt 186 lb 6.4 oz (84.6 kg)    SpO2 100%    BMI 30.55 kg/m  Wt Readings from Last 3 Encounters:  12/11/21 186 lb 6.4 oz (84.6 kg)  10/25/21 189 lb 3.2 oz (85.8 kg)  02/02/20 192 lb (87.1 kg)      Health Maintenance Due  Topic Date Due   HIV Screening  Never done   Hepatitis C Screening  Never done   PAP SMEAR-Modifier  Never done   COLONOSCOPY (Pts 45-70yrs Insurance coverage will need to be confirmed)  Never done   MAMMOGRAM  Never done   COVID-19 Vaccine (4 - Booster for [place] series) 02/06/2021    There are no preventive care reminders to display for this patient.  Lab Results  Component Value Date   TSH 1.26 11/08/2021   Lab Results  Component Value Date   WBC 7.5 11/08/2021   HGB 15.2 (H) 11/08/2021   HCT 45.0 11/08/2021   MCV 82.3 11/08/2021   PLT 249.0 11/08/2021   Lab Results  Component Value Date   NA 143 11/08/2021   K 4.0 11/08/2021   CO2 23 11/08/2021   GLUCOSE 99 11/08/2021   BUN 16 11/08/2021   CREATININE 1.14 11/08/2021   BILITOT 0.6 11/08/2021   ALKPHOS 65 11/08/2021   AST 22 11/08/2021   ALT 26 11/08/2021   PROT 7.2 11/08/2021   ALBUMIN 4.1 11/08/2021   CALCIUM 9.9 11/08/2021   ANIONGAP 13 02/02/2020   GFR 52.41 (L) 11/08/2021   Lab Results  Component Value Date   CHOL 257 (H) 11/08/2021   Lab Results  Component Value Date   HDL 56.00 11/08/2021   Lab Results  Component Value Date   LDLCALC 177 (H) 11/08/2021   Lab Results  Component Value Date   TRIG 120.0 11/08/2021   Lab Results  Component Value Date   CHOLHDL 5 11/08/2021   Lab Results  Component Value Date   HGBA1C 5.3 11/08/2021      Assessment & Plan:   Problem List Items Addressed This Visit   None Visit Diagnoses     Bacterial conjunctivitis    -  Primary   Relevant Medications   azithromycin (ZITHROMAX) 250 MG tablet   erythromycin ophthalmic ointment   Acute upper respiratory infection       Relevant Medications   predniSONE (DELTASONE) 20 MG tablet   azithromycin (ZITHROMAX) 250 MG tablet       Meds ordered this encounter  Medications   predniSONE (DELTASONE) 20 MG tablet    Sig: Take 1 tablet (20 mg total) by mouth daily with  breakfast.    Dispense:  5 tablet    Refill:  0    Order Specific Question:   Supervising Provider    Answer:   Carlota Raspberry, JEFFREY R [[id number]]   azithromycin (ZITHROMAX) 250 MG tablet    Sig: Take 2 tablets on day 1, then 1 tablet daily on days 2 through 5    Dispense:  6 tablet    Refill:  0    Order Specific Question:   Supervising Provider    Answer:   Carlota Raspberry, JEFFREY R [[id number]]   erythromycin ophthalmic ointment    Sig: Place 1 application. into the left eye in the morning and at bedtime.    Dispense:  14 g    Refill:  0    Order Specific Question:   Supervising Provider    Answer:   Carlota Raspberry, JEFFREY R [[id number]]    Follow-up: Return if symptoms worsen or fail to improve.   PLAN Z pack and prednisone Erythromycin for conjunctivitis Return  if worsening or failing to improve Patient encouraged to call clinic with any questions, comments, or concerns.  Maximiano Coss, NP

## 2022-01-31 ENCOUNTER — Other Ambulatory Visit: Payer: Self-pay | Admitting: Registered Nurse

## 2022-01-31 DIAGNOSIS — R519 Headache, unspecified: Secondary | ICD-10-CM

## 2022-01-31 DIAGNOSIS — I10 Essential (primary) hypertension: Secondary | ICD-10-CM

## 2022-04-24 ENCOUNTER — Ambulatory Visit: Payer: 59 | Admitting: Registered Nurse
